# Patient Record
Sex: Male | Born: 1970 | Race: White | Hispanic: Yes | Marital: Married | Smoking: Never smoker
Health system: Southern US, Community
[De-identification: ages and names within clinical notes are randomized; demographics above are authoritative.]

---

## 2009-07-19 ENCOUNTER — Emergency Department (HOSPITAL_BASED_OUTPATIENT_CLINIC_OR_DEPARTMENT_OTHER): Admission: EM | Admit: 2009-07-19 | Discharge: 2009-07-19 | Payer: Self-pay | Admitting: Emergency Medicine

## 2009-07-19 ENCOUNTER — Ambulatory Visit: Payer: Self-pay | Admitting: Diagnostic Radiology

## 2010-07-20 LAB — DIFFERENTIAL
Eosinophils Absolute: 0 10*3/uL (ref 0.0–0.7)
Eosinophils Relative: 0 % (ref 0–5)
Monocytes Relative: 4 % (ref 3–12)
Neutro Abs: 6.1 10*3/uL (ref 1.7–7.7)

## 2010-07-20 LAB — BASIC METABOLIC PANEL
Calcium: 9.3 mg/dL (ref 8.4–10.5)
Chloride: 105 mEq/L (ref 96–112)
Creatinine, Ser: 1 mg/dL (ref 0.4–1.5)
GFR calc non Af Amer: >60 mL/min (ref >60)

## 2010-07-20 LAB — CBC: Platelets: 230 10*3/uL (ref 150–400)

## 2010-07-20 LAB — POCT CARDIAC MARKERS: Myoglobin, poc: 87.9 ng/mL (ref 12–200)

## 2014-07-26 ENCOUNTER — Encounter (HOSPITAL_COMMUNITY): Payer: Self-pay | Admitting: *Deleted

## 2014-07-26 ENCOUNTER — Emergency Department (HOSPITAL_COMMUNITY)
Admission: EM | Admit: 2014-07-26 | Discharge: 2014-07-26 | Disposition: A | Discharge status: Home / Self Care | Payer: No Typology Code available for payment source | Attending: Emergency Medicine | Admitting: Emergency Medicine

## 2014-07-26 ENCOUNTER — Emergency Department (HOSPITAL_COMMUNITY): Payer: No Typology Code available for payment source

## 2014-07-26 DIAGNOSIS — S4992XA Unspecified injury of left shoulder and upper arm, initial encounter: Secondary | ICD-10-CM | POA: Diagnosis not present

## 2014-07-26 DIAGNOSIS — Y92410 Unspecified street and highway as the place of occurrence of the external cause: Secondary | ICD-10-CM | POA: Diagnosis not present

## 2014-07-26 DIAGNOSIS — Y998 Other external cause status: Secondary | ICD-10-CM | POA: Insufficient documentation

## 2014-07-26 DIAGNOSIS — S199XXA Unspecified injury of neck, initial encounter: Secondary | ICD-10-CM | POA: Insufficient documentation

## 2014-07-26 DIAGNOSIS — S3992XA Unspecified injury of lower back, initial encounter: Secondary | ICD-10-CM | POA: Insufficient documentation

## 2014-07-26 DIAGNOSIS — M542 Cervicalgia: Secondary | ICD-10-CM

## 2014-07-26 DIAGNOSIS — M25512 Pain in left shoulder: Secondary | ICD-10-CM

## 2014-07-26 DIAGNOSIS — Y9389 Activity, other specified: Secondary | ICD-10-CM | POA: Diagnosis not present

## 2014-07-26 DIAGNOSIS — M549 Dorsalgia, unspecified: Secondary | ICD-10-CM

## 2014-07-26 DIAGNOSIS — M7912 Myalgia of auxiliary muscles, head and neck: Secondary | ICD-10-CM

## 2014-07-26 MED ORDER — OXYCODONE-ACETAMINOPHEN 5-325 MG PO TABS
1.0000 | ORAL_TABLET | Freq: Once | ORAL | Status: AC
  Administered 2014-07-26: 1 via ORAL
  Filled 2014-07-26: qty 1

## 2014-07-26 MED ORDER — CYCLOBENZAPRINE HCL 10 MG PO TABS: 10.0000 mg | ORAL_TABLET | Freq: Two times a day (BID) | ORAL | Status: AC | PRN
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
## 2014-07-26 NOTE — ED Notes (Signed)
Patient transported to X-ray 
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
## 2019-01-12 ENCOUNTER — Other Ambulatory Visit: Payer: Self-pay

## 2019-01-12 DIAGNOSIS — Z20822 Contact with and (suspected) exposure to covid-19: Secondary | ICD-10-CM

## 2019-01-14 LAB — NOVEL CORONAVIRUS, NAA: SARS-CoV-2, NAA: NOT DETECTED
